# Patient Record
Sex: Male | Born: 1995 | Race: White | Hispanic: No | Marital: Married | Smoking: Never smoker
Health system: Southern US, Community
[De-identification: ages and names within clinical notes are randomized; demographics above are authoritative.]

---
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
## 2014-05-02 ENCOUNTER — Emergency Department: Payer: Self-pay | Admitting: Emergency Medicine

## 2014-05-02 LAB — BASIC METABOLIC PANEL
Anion Gap: 4 — ABNORMAL LOW (ref 7–16)
BUN: 14 mg/dL (ref 7–18)
CALCIUM: 9.6 mg/dL (ref 9.0–10.7)
CHLORIDE: 107 mmol/L (ref 98–107)
CREATININE: 1.06 mg/dL (ref 0.60–1.30)
Co2: 29 mmol/L (ref 21–32)
EGFR (African American): >60
EGFR (Non-African Amer.): >60
GLUCOSE: 87 mg/dL (ref 65–99)
Osmolality: 279 (ref 275–301)
Potassium: 3.9 mmol/L (ref 3.5–5.1)
SODIUM: 140 mmol/L (ref 136–145)

## 2014-05-02 LAB — CBC
HCT: 48.5 % (ref 40.0–52.0)
HGB: 15.7 g/dL (ref 13.0–18.0)
MCH: 29.1 pg (ref 26.0–34.0)
MCHC: 32.4 g/dL (ref 32.0–36.0)
MCV: 90 fL (ref 80–100)
PLATELETS: 241 10*3/uL (ref 150–440)
RBC: 5.41 10*6/uL (ref 4.40–5.90)
RDW: 13.3 % (ref 11.5–14.5)
WBC: 8.4 10*3/uL (ref 3.8–10.6)

## 2018-01-16 ENCOUNTER — Ambulatory Visit (INDEPENDENT_AMBULATORY_CARE_PROVIDER_SITE_OTHER): Payer: 59 | Admitting: Family Medicine

## 2018-01-16 ENCOUNTER — Other Ambulatory Visit: Payer: Self-pay

## 2018-01-16 ENCOUNTER — Encounter: Payer: Self-pay | Admitting: Family Medicine

## 2018-01-16 ENCOUNTER — Encounter: Payer: Self-pay | Admitting: *Deleted

## 2018-01-16 VITALS — BP 120/80 | HR 74 | Temp 98.5°F | Ht 72.0 in | Wt 189.2 lb

## 2018-01-16 DIAGNOSIS — N509 Disorder of male genital organs, unspecified: Secondary | ICD-10-CM

## 2018-01-16 DIAGNOSIS — N5089 Other specified disorders of the male genital organs: Secondary | ICD-10-CM

## 2018-01-16 NOTE — Patient Instructions (Signed)
REFERRALS TO SPECIALISTS, SPECIAL TESTS (MRI, CT, ULTRASOUNDS)  MARION or  Anastasiya will help you. ASK CHECK-IN FOR HELP.  Specialist appointment times vary a great deal, based on their schedule / openings. -- Some specialists have very long wait times. (Example. Dermatology)    

## 2018-01-16 NOTE — Progress Notes (Signed)
Dr. Karleen Hampshire T. Korie Streat, MD, CAQ [facility] [address] [place], [address] Phone: [phone] Fax: [phone]  01/16/2018  Patient: Dennis Boyer, MRN: [id number], DOB: [birth year], 22 y.o.  Primary Physician:  Excell Seltzer, MD   Chief Complaint  Patient presents with  . Lump Left Testicle   Subjective:   Dennis Boyer is a 22 y.o. very pleasant male patient who presents with the following:  New patient, not seen in 5 years.   Felt nausea some this week and felt some lump on the testicle. Today, felt a little better. Moving some pottery. Kind of soft.  It does not really hurt, but he has had some nausea and lower GI discomfort.  He is eating and drinking normally, having normal bowel movements.  He has not had any trauma or injury.  Is not had any pain.  There is nothing directly on the testicle itself.  He has had no STD exposure.  Left testicular mass.  Past Medical History, Surgical History, Social History, Family History, Problem List, Medications, and Allergies have been reviewed and updated if relevant.  Patient Active Problem List   Diagnosis Date Noted  . Chronic subdural hematoma (HCC) 03/27/2013  . 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 Unspecified constipation 03/27/2013  . Headache(784.0) 01/22/2013  . Dyshidrotic eczema 05/11/2011  . 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 CHILDHOOD OBESITY 10/04/2010  . ALLERGIC RHINITIS 10/04/2010  . ASTHMA, PERSISTENT, MILD 10/04/2010    History reviewed. No pertinent past medical history.  History reviewed. No pertinent surgical history.  Social History   Socioeconomic History  . Marital status: Single    Spouse name: Not on file  . Number of children: Not on file  . Years of education: Not on file  . Highest education level: Not on file  Occupational History  . Not on file  Social Needs  . Financial resource strain: Not on file  . Food  insecurity:    Worry: Not on file    Inability: Not on file  . Transportation needs:    Medical: Not on file    Non-medical: Not on file  Tobacco Use  . Smoking status: Never Smoker  . Smokeless tobacco: Never Used  Substance and Sexual Activity  . Alcohol use: No  . Drug use: No  . Sexual activity: Not on file  Lifestyle  . Physical activity:    Days per week: Not on file    Minutes per session: Not on file  . Stress: Not on file  Relationships  . Social connections:    Talks on phone: Not on file    Gets together: Not on file    Attends religious service: Not on file    Active member of club or organization: Not on file    Attends meetings of clubs or organizations: Not on file    Relationship status: Not on file  . Intimate partner violence:    Fear of current or ex partner: Not on file    Emotionally abused: Not on file    Physically abused: Not on file    Forced sexual activity: Not on file  Other Topics Concern  . Not on file  Social History Narrative   Lives with mom and dad   Only child   [employer]-- 10th grade   Plans on [employer]   Lifts weights regularly   Jogging several times a week   Diet: Fruits  and veggies, water, skim milk             Family History  Problem Relation Age of Onset  . Diabetes Father   . COPD Maternal Grandmother   . Hypertension Maternal Grandfather   . Cancer Maternal Grandfather        LUNG  . Diabetes Paternal Grandfather   . Stroke Paternal Grandfather     No Known Allergies  Medication list reviewed and updated in full in [facility].   GEN: No acute illnesses, no fevers, chills. GI: No n/v/d, eating normally Pulm: No SOB Interactive and getting along well at home.  Otherwise, ROS is as per the HPI.  Objective:   BP 120/80   Pulse 74   Temp 98.5 F (36.9 C) (Oral)   Ht 6' (1.829 m)   Wt 189 lb 4 oz (85.8 kg)   BMI 25.67 kg/m   GEN: WDWN, NAD, Non-toxic, A & O x 3 HEENT: Atraumatic,  Normocephalic. Neck supple. No masses, No LAD. Ears and Nose: No external deformity. EXTR: No c/c/e NEURO Normal gait.  PSYCH: Normally interactive. Conversant. Not depressed or anxious appearing.  Calm demeanor.   GU: Normal male.  On the right side the testicle is normal without mass.  Epididymis is normal, as well as the entirety of the vas that is palpated.  There is no appreciable hernia.  On the left side, the entirety of the testicle does appear to be normal without mass, but caudal to this there is dilation and a palpable soft tissue caudal to this.  This does not increase in size with coughing.  There is no pain with palpation.  Laboratory and Imaging Data:  Assessment and Plan:   Mass of left testicle - Plan: US SCROTUM  Clinically, this feels most like a varicocele.  Epididymitis would be less likely, and hernia less likely, but cannot fully be excluded.  Pertinent do ultrasound of the patient's scrotum to fully evaluate this.  Follow-up: No follow-ups on file.  Orders Placed This Encounter  Procedures  . US SCROTUM    Signed,  [place] T. Derrek Puff, MD   Allergies as of 01/16/2018   No Known Allergies     Medication List    as of 01/16/2018 11:19 AM   You have not been prescribed any medications.

## 2018-01-16 NOTE — Addendum Note (Signed)
Addended by: Damita Lack on: 01/16/2018 11:27 AM   Modules accepted: Orders

## 2018-01-20 ENCOUNTER — Ambulatory Visit
Admission: RE | Admit: 2018-01-20 | Discharge: 2018-01-20 | Disposition: A | Discharge status: Home / Self Care | Payer: 59 | Source: Ambulatory Visit | Attending: Family Medicine | Admitting: Family Medicine

## 2018-01-20 DIAGNOSIS — N509 Disorder of male genital organs, unspecified: Secondary | ICD-10-CM | POA: Insufficient documentation

## 2018-01-20 DIAGNOSIS — N5089 Other specified disorders of the male genital organs: Secondary | ICD-10-CM | POA: Diagnosis not present

## 2018-09-05 ENCOUNTER — Encounter: Payer: Self-pay | Admitting: Family Medicine

## 2018-09-05 ENCOUNTER — Ambulatory Visit (INDEPENDENT_AMBULATORY_CARE_PROVIDER_SITE_OTHER): Payer: 59 | Admitting: Family Medicine

## 2018-09-05 DIAGNOSIS — N50812 Left testicular pain: Secondary | ICD-10-CM

## 2018-09-05 LAB — POC URINALSYSI DIPSTICK (AUTOMATED)
BILIRUBIN UA: NEGATIVE
Blood, UA: NEGATIVE
GLUCOSE UA: NEGATIVE
Ketones, UA: NEGATIVE
LEUKOCYTES UA: NEGATIVE
Nitrite, UA: NEGATIVE
PH UA: 6 (ref 5.0–8.0)
Protein, UA: NEGATIVE
Spec Grav, UA: 1.01 (ref 1.010–1.025)
Urobilinogen, UA: 0.2 E.U./dL

## 2018-09-05 NOTE — Assessment & Plan Note (Signed)
New pain.Marland Kitchen intermittent. None now. Exam benign.. not clearly epididymitis.Marland Kitchen pt NEVER sexually active per report.  Eval with [facility].  Nml [place] last year, but given new pain.Marland Kitchen re-eval with [place]. If negative and continues  To have pain reconsider non GC/Chlam epididymitis although < 35 given never sexually active.

## 2018-09-05 NOTE — Patient Instructions (Signed)
Front office will call with your [place] referral.

## 2018-09-05 NOTE — Progress Notes (Signed)
   Subjective:    Patient ID: Dennis Boyer, male    DOB: 03/16/1996, 23 y.o.   MRN: [id number]  HPI    23 year old male presents with 2 days onset of  Testicular pain.  12/2017 left testicular mass noted. US scrotum showed no abnormality.  Clinically felt like varicocele.  He reports he noted slight pain in left testicle.. felt like vein was bigger  Near the testicle. Pain was 1-2/10... since then pain comes and goes. Notes more pain when crossing legs on lying on side.  no abd pain, no groin pain.   no rash, no visual change of skin. No penile discharge.  no penile pain.  no pain in perineum.   No injury or trauma.  Not sexually active.. has never been.   No family history of testicular cancer etc.  Review of Systems  Constitutional: Negative for fatigue and fever.  HENT: Negative for ear pain.   Eyes: Negative for pain.  Respiratory: Negative for cough and shortness of breath.   Cardiovascular: Negative for chest pain, palpitations and leg swelling.  Gastrointestinal: Negative for abdominal pain.  Genitourinary: Negative for dysuria.  Musculoskeletal: Negative for arthralgias.  Neurological: Negative for syncope, light-headedness and headaches.  Psychiatric/Behavioral: Negative for dysphoric mood.       Objective:   Physical Exam Constitutional:      Appearance: He is well-developed.  HENT:     Head: Normocephalic.     Right Ear: Hearing normal.     Left Ear: Hearing normal.     Nose: Nose normal.  Neck:     Thyroid: No thyroid mass or thyromegaly.     Vascular: No carotid bruit.     Trachea: Trachea normal.  Cardiovascular:     Rate and Rhythm: Normal rate and regular rhythm.     Pulses: Normal pulses.     Heart sounds: Heart sounds not distant. No murmur. No friction rub. No gallop.      Comments: No peripheral edema Pulmonary:     Effort: Pulmonary effort is normal. No respiratory distress.     Breath sounds: Normal breath sounds.  Abdominal:   Hernia: There is no hernia in the right inguinal area or left inguinal area.  Genitourinary:    Scrotum/Testes: Normal. Cremasteric reflex is present.        Right: Mass, tenderness, swelling, testicular hydrocele or varicocele not present. Right testis is descended.        Left: Mass, tenderness, swelling or testicular hydrocele not present. Left testis is descended.     Epididymis:     Right: Normal. Not inflamed or enlarged. No mass.     Left: Normal. Not inflamed or enlarged. No mass.     Comments:  Vessels slight more prominent on left than right Lymphadenopathy:     Lower Body: No right inguinal adenopathy. No left inguinal adenopathy.  Skin:    General: Skin is warm and dry.     Findings: No rash.  Psychiatric:        Speech: Speech normal.        Behavior: Behavior normal.        Thought Content: Thought content normal.           Assessment & Plan:

## 2018-09-05 NOTE — Addendum Note (Signed)
Addended by: Damita Lack on: 09/05/2018 11:04 AM   Modules accepted: Orders

## 2018-09-05 NOTE — Addendum Note (Signed)
Addended by: Damita Lack on: 09/05/2018 10:54 AM   Modules accepted: Orders

## 2018-09-08 ENCOUNTER — Ambulatory Visit
Admission: RE | Admit: 2018-09-08 | Discharge: 2018-09-08 | Disposition: A | Discharge status: Home / Self Care | Payer: 59 | Source: Ambulatory Visit | Attending: Family Medicine | Admitting: Family Medicine

## 2018-09-08 DIAGNOSIS — N50812 Left testicular pain: Secondary | ICD-10-CM | POA: Diagnosis not present

## 2019-05-12 ENCOUNTER — Encounter: Payer: Self-pay | Admitting: Family Medicine

## 2019-05-12 ENCOUNTER — Ambulatory Visit (INDEPENDENT_AMBULATORY_CARE_PROVIDER_SITE_OTHER): Payer: 59 | Admitting: Family Medicine

## 2019-05-12 ENCOUNTER — Other Ambulatory Visit: Payer: Self-pay

## 2019-05-12 VITALS — BP 120/90 | HR 69 | Temp 98.2°F | Ht 72.0 in | Wt 203.5 lb

## 2019-05-12 DIAGNOSIS — H6982 Other specified disorders of Eustachian tube, left ear: Secondary | ICD-10-CM

## 2019-05-12 NOTE — Progress Notes (Signed)
  Chief Complaint  Patient presents with  . left ear issue    hears "wave like sounds"    History of Present Illness: HPI  23 year old male with 24 hours of left ear feeling hot, wave like sound, rushing sound.  Nml hearing in both ears.  No ear pain, no sinus pain, no pressure. Mild ear pressure in left ear. No ST, no cough, no fever.  No headache. No heartbeat sound.  BP Readings from Last 3 Encounters:  05/12/19 120/90  09/05/18 120/70  01/16/18 120/80   No new meds, no new OTC meds or supplements.  No history of recurrent ear infections.  Mild asthma, no current issues.  COVID 19 screen No recent travel or known exposure to COVID19 The patient denies respiratory symptoms of COVID 19 at this time.  The importance of social distancing was discussed today.   Review of Systems  Constitutional: Negative for chills and fever.  HENT: Negative for congestion and ear pain.   Eyes: Negative for pain and redness.  Respiratory: Negative for cough and shortness of breath.   Cardiovascular: Negative for chest pain, palpitations and leg swelling.  Gastrointestinal: Negative for abdominal pain, blood in stool, constipation, diarrhea, nausea and vomiting.  Genitourinary: Negative for dysuria.  Musculoskeletal: Negative for falls and myalgias.  Skin: Negative for rash.  Neurological: Negative for dizziness.  Psychiatric/Behavioral: Negative for depression. The patient is not nervous/anxious.       History reviewed. No pertinent past medical history.  reports that he has never smoked. He has never used smokeless tobacco. He reports that he does not drink alcohol or use drugs.  No current outpatient medications on file.   Observations/Objective: Blood pressure 120/90, pulse 69, temperature 98.2 F (36.8 C), temperature source Temporal, height 6' (1.829 m), weight 203 lb 8 oz (92.3 kg), SpO2 97 %.  Physical Exam Constitutional:      Appearance: He is well-developed.  HENT:    Head: Normocephalic.     Right Ear: Hearing normal. A middle ear effusion is present. Tympanic membrane is bulging. Tympanic membrane is not injected, erythematous or retracted.     Left Ear: Hearing normal.  No middle ear effusion. Tympanic membrane is not injected, erythematous, retracted or bulging.     Nose: Nose normal.  Neck:     Thyroid: No thyroid mass or thyromegaly.     Vascular: No carotid bruit.     Trachea: Trachea normal.  Cardiovascular:     Rate and Rhythm: Normal rate and regular rhythm.     Pulses: Normal pulses.     Heart sounds: Heart sounds not distant. No murmur. No friction rub. No gallop.      Comments: No peripheral edema Pulmonary:     Effort: Pulmonary effort is normal. No respiratory distress.     Breath sounds: Normal breath sounds.  Skin:    General: Skin is warm and dry.     Findings: No rash.  Psychiatric:        Speech: Speech normal.        Behavior: Behavior normal.        Thought Content: Thought content normal.      Assessment and Plan   ETD (Eustachian tube dysfunction), left Treat with antihistamine, nasal steroid and decongestant. If not improving consider antibiotics.     Eliezer Lofts, MD

## 2019-05-12 NOTE — Assessment & Plan Note (Signed)
Treat with antihistamine, nasal steroid and decongestant. If not improving consider antibiotics.

## 2019-05-12 NOTE — Patient Instructions (Signed)
Start flonase 2 sprays per nostril daily.  Start zyrtec or Claritin D 1-2 times daily.  Call if notimproving in next 1-2 weeks or sooner if increasing ear pain,or fever.

## 2019-07-03 ENCOUNTER — Ambulatory Visit (INDEPENDENT_AMBULATORY_CARE_PROVIDER_SITE_OTHER): Payer: 59 | Admitting: Primary Care

## 2019-07-03 ENCOUNTER — Other Ambulatory Visit: Payer: Self-pay | Admitting: *Deleted

## 2019-07-03 VITALS — Temp 100.0°F

## 2019-07-03 DIAGNOSIS — Z20822 Contact with and (suspected) exposure to covid-19: Secondary | ICD-10-CM

## 2019-07-03 DIAGNOSIS — R509 Fever, unspecified: Secondary | ICD-10-CM

## 2019-07-03 NOTE — Progress Notes (Signed)
Subjective:    Patient ID: Dennis Boyer, male    DOB: 1996/06/10, 23 y.o.   MRN: [id number]  HPI  Virtual Visit via Video Note  I connected with Dennis Boyer on 07/03/19 at 10:40 AM EST by a video enabled telemedicine application and verified that I am speaking with the correct person using two identifiers.  Location: Patient: Home Provider: Office   I discussed the limitations of evaluation and management by telemedicine and the availability of in person appointments. The patient expressed understanding and agreed to proceed.  History of Present Illness:  Dennis Boyer is a 23 year old male patient of Dr. Ermalene Searing with a history of allergic rhinitis, asthma, chronic subdural hematoma, eustachian tube dysfunction who presents today with a chief complaint of fever.  His temperatures are ranging 100.0-102 temporal. He had his flu shot in September 2020. He also reports body aches, headache that began yesterday, fever of 102 yesterday. He's taken Aleve with some improvement in all symptoms. His fever this morning was 100.0 temporal. He denies known Covid-19 exposure, last time he's been out of the house was five days ago, was wearing a mask.   He denies loss of taste/smell, diarrhea, cough. Overall today he's feeling slightly better.    Observations/Objective:  Alert and oriented. Appears well, not sickly. No distress. Speaking in complete sentences. No cough during exam.  Assessment and Plan:  Fever with body aches, chills, headache x 24 hours. Did have flu shot two months ago. Given ongoing pandemic we will send him for Covid-19 testing. Dicussed home care instructions and quarantine instructions. He will update if symptoms progress.   Follow Up Instructions:  Go for Covid-19 testing as discussed.  Remain home until your Covid test has returned and/or until you have been fever free for three consecutive days without the use of Aleve/Tyelnol, etc.   It was  a pleasure meeting you! Mayra Reel, NP-C  I discussed the assessment and treatment plan with the patient. The patient was provided an opportunity to ask questions and all were answered. The patient agreed with the plan and demonstrated an understanding of the instructions.   The patient was advised to call back or seek an in-person evaluation if the symptoms worsen or if the condition fails to improve as anticipated.     Doreene Nest, NP    Review of Systems  Constitutional: Positive for chills, fatigue and fever.  HENT:       Denies loss of taste/smell  Respiratory: Negative for cough and wheezing.   Gastrointestinal: Negative for diarrhea.  Allergic/Immunologic: Positive for environmental allergies.  Neurological: Positive for headaches.       No past medical history on file.   Social History   Socioeconomic History  . Marital status: Single    Spouse name: Not on file  . Number of children: Not on file  . Years of education: Not on file  . Highest education level: Not on file  Occupational History  . Not on file  Social Needs  . Financial resource strain: Not on file  . Food insecurity    Worry: Not on file    Inability: Not on file  . Transportation needs    Medical: Not on file    Non-medical: Not on file  Tobacco Use  . Smoking status: Never Smoker  . Smokeless tobacco: Never Used  Substance and Sexual Activity  . Alcohol use: No  . Drug use: No  . Sexual activity: Not  on file  Lifestyle  . Physical activity    Days per week: Not on file    Minutes per session: Not on file  . Stress: Not on file  Relationships  . Social Herbalist on phone: Not on file    Gets together: Not on file    Attends religious service: Not on file    Active member of club or organization: Not on file    Attends meetings of clubs or organizations: Not on file    Relationship status: Not on file  . Intimate partner violence    Fear of current or ex partner:  Not on file    Emotionally abused: Not on file    Physically abused: Not on file    Forced sexual activity: Not on file  Other Topics Concern  . Not on file  Social History Narrative   Lives with mom and dad   Only child   [employer]-- 10th grade   Plans on [employer]   Lifts weights regularly   Jogging several times a week   Diet: Fruits and veggies, water, skim milk             No past surgical history on file.  Family History  Problem Relation Age of Onset  . Diabetes Father   . COPD Maternal Grandmother   . Hypertension Maternal Grandfather   . Cancer Maternal Grandfather        LUNG  . Diabetes Paternal Grandfather   . Stroke Paternal Grandfather     No Known Allergies  No current outpatient medications on file prior to visit.   No current facility-administered medications on file prior to visit.     Temp 100 F (37.8 C) (Temporal)    Objective:   Physical Exam  Constitutional: He is oriented to person, place, and time. He appears well-nourished. He does not have a sickly appearance. He does not appear ill.  Respiratory: Effort normal. No respiratory distress.  No cough during exam  Neurological: He is alert and oriented to person, place, and time.  Psychiatric: He has a normal mood and affect.           Assessment & Plan:

## 2019-07-03 NOTE — Patient Instructions (Signed)
Go for Covid-19 testing as discussed.  Remain home until your Covid test has returned and/or until you have been fever free for three consecutive days without the use of Aleve/Tyelnol, etc.   It was a pleasure meeting you! Allie Bossier, NP-C

## 2019-07-06 LAB — NOVEL CORONAVIRUS, NAA: SARS-CoV-2, NAA: NOT DETECTED

## 2019-07-07 ENCOUNTER — Telehealth: Payer: Self-pay

## 2019-07-07 NOTE — Telephone Encounter (Signed)
Patient called and states he needs a copy of his immunization record, as he is getting his paperwork and all ready for college and they require a copy of his immunizations.

## 2019-07-07 NOTE — Telephone Encounter (Signed)
I looked him up on [facility] and did not find a complete record. He will check with his former high school and see if they can help.

## 2019-08-06 ENCOUNTER — Telehealth: Payer: Self-pay | Admitting: Family Medicine

## 2019-08-06 NOTE — Telephone Encounter (Signed)
Left v/m for pt to call [facility].

## 2019-08-06 NOTE — Telephone Encounter (Signed)
Mom Wannetta Sender) call pt needs a refill on a inhaler he had not has one in over a year pt is in [place].  Mom will have pt call office so we can triage him  Mom thinks he has SOB

## 2019-08-06 NOTE — Telephone Encounter (Signed)
Pt called back and said he needs the albuterol inhaler due to being around cats. Pt said he does not have SOB. Pt has been staying with a friend who has cats for 2 weeks;no cough, no SOB,no fever,no distress with breathing. Pt has hx of asthma and pt can tell some difference in breathing but no SOB. Pt request refill albuterol inhaler to [facility].  last refilled albuterol in [facility] # 3 inhaler x3 10/18/2011. Pt last acute visit 07/03/19 and do not see recent FU or annual.Please advise. UC & ED precautions given and pt voiced understanding.

## 2019-08-07 MED ORDER — ALBUTEROL SULFATE HFA 108 (90 BASE) MCG/ACT IN AERS: 2.0000 | INHALATION_SPRAY | RESPIRATORY_TRACT | 1 refills | Status: DC | PRN

## 2019-08-07 NOTE — Telephone Encounter (Signed)
I will refill once but pt needs to be seen for annual physical. Call to schedule.

## 2019-08-12 NOTE — Telephone Encounter (Signed)
Labs 2/1 cpx 2/5 Pt aware

## 2019-09-21 ENCOUNTER — Other Ambulatory Visit (INDEPENDENT_AMBULATORY_CARE_PROVIDER_SITE_OTHER): Payer: 59

## 2019-09-21 ENCOUNTER — Telehealth: Payer: Self-pay | Admitting: Family Medicine

## 2019-09-21 DIAGNOSIS — Z130 Encounter for screening for diseases of the blood and blood-forming organs and certain disorders involving the immune mechanism: Secondary | ICD-10-CM | POA: Diagnosis not present

## 2019-09-21 DIAGNOSIS — Z13220 Encounter for screening for lipoid disorders: Secondary | ICD-10-CM | POA: Diagnosis not present

## 2019-09-21 NOTE — Telephone Encounter (Signed)
-----   Message from Aquilla Solian, RT sent at 09/11/2019  1:25 PM EST ----- Regarding: Lab Orders for Monday 2.1.2021 Please place lab orders for Monday 2.1.2021, office visit for physical on Friday 2.5.2021 Thank you, Jones Bales RT(R)

## 2019-09-22 LAB — COMPREHENSIVE METABOLIC PANEL
ALT: 10 U/L (ref 0–53)
AST: 16 U/L (ref 0–37)
Albumin: 4.5 g/dL (ref 3.5–5.2)
Alkaline Phosphatase: 51 U/L (ref 39–117)
BUN: 15 mg/dL (ref 6–23)
CO2: 29 mEq/L (ref 19–32)
Calcium: 9.8 mg/dL (ref 8.4–10.5)
Chloride: 101 mEq/L (ref 96–112)
Creatinine, Ser: 0.97 mg/dL (ref 0.40–1.50)
GFR: 94.7 mL/min (ref >60.00)
Glucose, Bld: 79 mg/dL (ref 70–99)
Potassium: 4.3 mEq/L (ref 3.5–5.1)
Sodium: 140 mEq/L (ref 135–145)
Total Bilirubin: 0.8 mg/dL (ref 0.2–1.2)
Total Protein: 7.4 g/dL (ref 6.0–8.3)

## 2019-09-22 LAB — CBC WITH DIFFERENTIAL/PLATELET
Basophils Absolute: 0 10*3/uL (ref 0.0–0.1)
Basophils Relative: 0.5 % (ref 0.0–3.0)
Eosinophils Absolute: 0.3 10*3/uL (ref 0.0–0.7)
Eosinophils Relative: 3.9 % (ref 0.0–5.0)
HCT: 42.4 % (ref 39.0–52.0)
Hemoglobin: 14.5 g/dL (ref 13.0–17.0)
Lymphocytes Relative: 25.9 % (ref 12.0–46.0)
Lymphs Abs: 1.9 10*3/uL (ref 0.7–4.0)
MCHC: 34.1 g/dL (ref 30.0–36.0)
MCV: 89.4 fl (ref 78.0–100.0)
Monocytes Absolute: 0.5 10*3/uL (ref 0.1–1.0)
Monocytes Relative: 7.5 % (ref 3.0–12.0)
Neutro Abs: 4.5 10*3/uL (ref 1.4–7.7)
Neutrophils Relative %: 62.2 % (ref 43.0–77.0)
Platelets: 271 10*3/uL (ref 150.0–400.0)
RBC: 4.75 Mil/uL (ref 4.22–5.81)
RDW: 12.9 % (ref 11.5–15.5)
WBC: 7.3 10*3/uL (ref 4.0–10.5)

## 2019-09-22 LAB — LIPID PANEL
Cholesterol: 133 mg/dL (ref 0–200)
HDL: 53.4 mg/dL (ref >39.00)
LDL Cholesterol: 73 mg/dL (ref 0–99)
NonHDL: 79.5
Total CHOL/HDL Ratio: 2
Triglycerides: 33 mg/dL (ref 0.0–149.0)
VLDL: 6.6 mg/dL (ref 0.0–40.0)

## 2019-09-22 NOTE — Progress Notes (Signed)
No critical labs need to be addressed urgently. We will discuss labs in detail at upcoming office visit.   

## 2019-09-25 ENCOUNTER — Ambulatory Visit (INDEPENDENT_AMBULATORY_CARE_PROVIDER_SITE_OTHER): Payer: 59 | Admitting: Family Medicine

## 2019-09-25 ENCOUNTER — Other Ambulatory Visit: Payer: Self-pay

## 2019-09-25 ENCOUNTER — Encounter: Payer: Self-pay | Admitting: Family Medicine

## 2019-09-25 VITALS — BP 110/82 | HR 72 | Temp 97.8°F | Ht 72.25 in | Wt 201.0 lb

## 2019-09-25 DIAGNOSIS — Z0000 Encounter for general adult medical examination without abnormal findings: Secondary | ICD-10-CM

## 2019-09-25 DIAGNOSIS — Z23 Encounter for immunization: Secondary | ICD-10-CM

## 2019-09-25 NOTE — Progress Notes (Signed)
Chief Complaint  Patient presents with  . Annual Exam    History of Present Illness: HPI  The patient is here for annual wellness exam and preventative care.     Feeling well overall.  Mild persistent asthma: uses albuterol prn. Using after exercise.  Hx of chronic subdural hematoma, ararcnoid cyst: no headache, no vision cahnges no issues.   Reviewed labs in detail, nml cbc, cmet and lipids.   Body mass index is 27.07 kg/m. Exercise: was doing several times a week. Diet:  moderate Wt Readings from Last 3 Encounters:  09/25/19 201 lb (91.2 kg)  05/12/19 203 lb 8 oz (92.3 kg)  09/05/18 190 lb 12 oz (86.5 kg)     Depression screen PHQ 2/9 09/25/2019  Decreased Interest 0  Down, Depressed, Hopeless 0  PHQ - 2 Score 0    This visit occurred during the SARS-CoV-2 public health emergency.  Safety protocols were in place, including screening questions prior to the visit, additional usage of staff PPE, and extensive cleaning of exam room while observing appropriate contact time as indicated for disinfecting solutions.   COVID 19 screen:  No recent travel or known exposure to COVID19 The patient denies respiratory symptoms of COVID 19 at this time. The importance of social distancing was discussed today.     Review of Systems  Constitutional: Negative for chills and fever.  HENT: Negative for congestion and ear pain.   Eyes: Negative for pain and redness.  Respiratory: Negative for cough and shortness of breath.   Cardiovascular: Negative for chest pain, palpitations and leg swelling.  Gastrointestinal: Negative for abdominal pain, blood in stool, constipation, diarrhea, nausea and vomiting.  Genitourinary: Negative for dysuria.  Musculoskeletal: Negative for falls and myalgias.  Skin: Negative for rash.  Neurological: Negative for dizziness.  Psychiatric/Behavioral: Negative for depression. The patient is not nervous/anxious.       No past medical history on file.  reports that he has never smoked. He has never used smokeless tobacco. He reports that he does not drink alcohol or use drugs.   Current Outpatient Medications:  .  albuterol (VENTOLIN HFA) 108 (90 Base) MCG/ACT inhaler, Inhale 2 puffs into the lungs every 4 (four) hours as needed for wheezing., Disp: 6.7 g, Rfl: 1 .  loratadine (CLARITIN) 10 MG tablet, Take 10 mg by mouth daily as needed for allergies., Disp: , Rfl:    Observations/Objective: Blood pressure 110/82, pulse 72, temperature 97.8 F (36.6 C), height 6' 0.25" (1.835 m), weight 201 lb (91.2 kg), SpO2 94 %.  Physical Exam Constitutional:      General: He is not in acute distress.    Appearance: Normal appearance. He is well-developed. He is not ill-appearing or toxic-appearing.  HENT:     Head: Normocephalic and atraumatic.     Right Ear: Hearing, tympanic membrane, ear canal and external ear normal.     Left Ear: Hearing, tympanic membrane, ear canal and external ear normal.     Nose: Nose normal.     Mouth/Throat:     Pharynx: Uvula midline.  Eyes:     General: Lids are normal. Lids are everted, no foreign bodies appreciated.     Conjunctiva/sclera: Conjunctivae normal.     Pupils: Pupils are equal, round, and reactive to light.  Neck:     Thyroid: No thyroid mass or thyromegaly.     Vascular: No carotid bruit.     Trachea: Trachea and phonation normal.  Cardiovascular:     Rate and  Rhythm: Normal rate and regular rhythm.     Pulses: Normal pulses.     Heart sounds: S1 normal and S2 normal. No murmur. No gallop.   Pulmonary:     Breath sounds: Normal breath sounds. No wheezing, rhonchi or rales.  Abdominal:     General: Bowel sounds are normal.     Palpations: Abdomen is soft.     Tenderness: There is no abdominal tenderness. There is no guarding or rebound.     Hernia: No hernia is present.  Musculoskeletal:     Cervical back: Normal range of motion and neck supple.  Lymphadenopathy:     Cervical: No cervical  adenopathy.  Skin:    General: Skin is warm and dry.     Findings: No rash.  Neurological:     Mental Status: He is alert.     Cranial Nerves: No cranial nerve deficit.     Sensory: No sensory deficit.     Gait: Gait normal.     Deep Tendon Reflexes: Reflexes are normal and symmetric.  Psychiatric:        Speech: Speech normal.        Behavior: Behavior normal.        Judgment: Judgment normal.      Assessment and Plan   The patient's preventative maintenance and recommended screening tests for an annual wellness exam were reviewed in full today. Brought up to date unless services declined.  Counselled on the importance of diet, exercise, and its role in overall health and mortality. The patient's FH and SH was reviewed, including their home life, tobacco status, and drug and alcohol status.     Vaccines: Due for Tdap, uptodate with flu Prostate Cancer Screen: no family history  Colon Cancer Screen: no early family history      Smoking Status: nonsmoker ETOH/ drug use: moderate (1-2 drink every other night)/none  HIV screen:   No concern,  Low number of partners.   Kerby Nora, MD

## 2019-09-25 NOTE — Patient Instructions (Signed)
Preventive Care 19-25 Years Old, Male Preventive care refers to lifestyle choices and visits with your health care provider that can promote health and wellness. This includes:  A yearly physical exam. This is also called an annual well check.  Regular dental and eye exams.  Immunizations.  Screening for certain conditions.  Healthy lifestyle choices, such as eating a healthy diet, getting regular exercise, not using drugs or products that contain nicotine and tobacco, and limiting alcohol use. What can I expect for my preventive care visit? Physical exam Your health care provider will check:  Height and weight. These may be used to calculate body mass index (BMI), which is a measurement that tells if you are at a healthy weight.  Heart rate and blood pressure.  Your skin for abnormal spots. Counseling Your health care provider may ask you questions about:  Alcohol, tobacco, and drug use.  Emotional well-being.  Home and relationship well-being.  Sexual activity.  Eating habits.  Work and work Statistician. What immunizations do I need?  Influenza (flu) vaccine  This is recommended every year. Tetanus, diphtheria, and pertussis (Tdap) vaccine  You may need a Td booster every 10 years. Varicella (chickenpox) vaccine  You may need this vaccine if you have not already been vaccinated. Human papillomavirus (HPV) vaccine  If recommended by your health care provider, you may need three doses over 6 months. Measles, mumps, and rubella (MMR) vaccine  You may need at least one dose of MMR. You may also need a second dose. Meningococcal conjugate (MenACWY) vaccine  One dose is recommended if you are 45-76 years old and a Market researcher living in a residence hall, or if you have one of several medical conditions. You may also need additional booster doses. Pneumococcal conjugate (PCV13) vaccine  You may need this if you have certain conditions and were not  previously vaccinated. Pneumococcal polysaccharide (PPSV23) vaccine  You may need one or two doses if you smoke cigarettes or if you have certain conditions. Hepatitis A vaccine  You may need this if you have certain conditions or if you travel or work in places where you may be exposed to hepatitis A. Hepatitis B vaccine  You may need this if you have certain conditions or if you travel or work in places where you may be exposed to hepatitis B. Haemophilus influenzae type b (Hib) vaccine  You may need this if you have certain risk factors. You may receive vaccines as individual doses or as more than one vaccine together in one shot (combination vaccines). Talk with your health care provider about the risks and benefits of combination vaccines. What tests do I need? Blood tests  Lipid and cholesterol levels. These may be checked every 5 years starting at age 17.  Hepatitis C test.  Hepatitis B test. Screening   Diabetes screening. This is done by checking your blood sugar (glucose) after you have not eaten for a while (fasting).  Sexually transmitted disease (STD) testing. Talk with your health care provider about your test results, treatment options, and if necessary, the need for more tests. Follow these instructions at home: Eating and drinking   Eat a diet that includes fresh fruits and vegetables, whole grains, lean protein, and low-fat dairy products.  Take vitamin and mineral supplements as recommended by your health care provider.  Do not drink alcohol if your health care provider tells you not to drink.  If you drink alcohol: ? Limit how much you have to 0-2  drinks a day. ? Be aware of how much alcohol is in your drink. In the [place] one drink equals one 12 oz bottle of beer (355 mL), one 5 oz glass of wine (148 mL), or one 1 oz glass of hard liquor (44 mL). Lifestyle  Take daily care of your teeth and gums.  Stay active. Exercise for at least 30 minutes on 5 or  more days each week.  Do not use any products that contain nicotine or tobacco, such as cigarettes, e-cigarettes, and chewing tobacco. If you need help quitting, ask your health care provider.  If you are sexually active, practice safe sex. Use a condom or other form of protection to prevent STIs (sexually transmitted infections). What's next?  Go to your health care provider once a year for a well check visit.  Ask your health care provider how often you should have your eyes and teeth checked.  Stay up to date on all vaccines. This information is not intended to replace advice given to you by your health care provider. Make sure you discuss any questions you have with your health care provider. Document Revised: 07/31/2018 Document Reviewed: 07/31/2018 Elsevier Patient Education  2020 [employer].

## 2019-09-25 NOTE — Addendum Note (Signed)
Addended by: Consuella Lose on: 09/25/2019 05:06 PM   Modules accepted: Orders

## 2019-10-07 IMAGING — US US SCROTUM W/ DOPPLER COMPLETE
1 series · 14 of 25 positions shown · non-contrast
Comparison: None.

CLINICAL DATA: Initial evaluation for left testicular mass.

EXAM:
SCROTAL ULTRASOUND
DOPPLER ULTRASOUND OF THE TESTICLES
TECHNIQUE: Complete ultrasound examination of the testicles, epididymis, and
other scrotal structures was performed. Color and spectral Doppler
ultrasound were also utilized to evaluate blood flow to the
testicles.

[Series 1: us scrotum w/ doppler complete · 0.07mm/px · 14 of 95 slices shown]
[im 1/95]
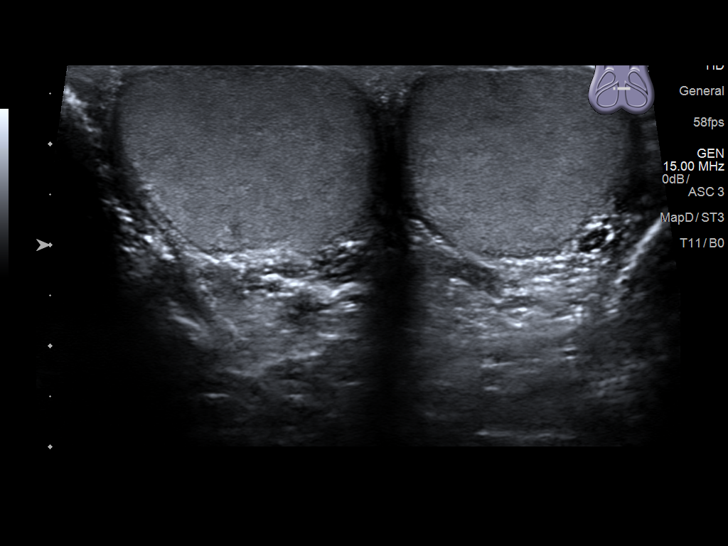
[im 8/95]
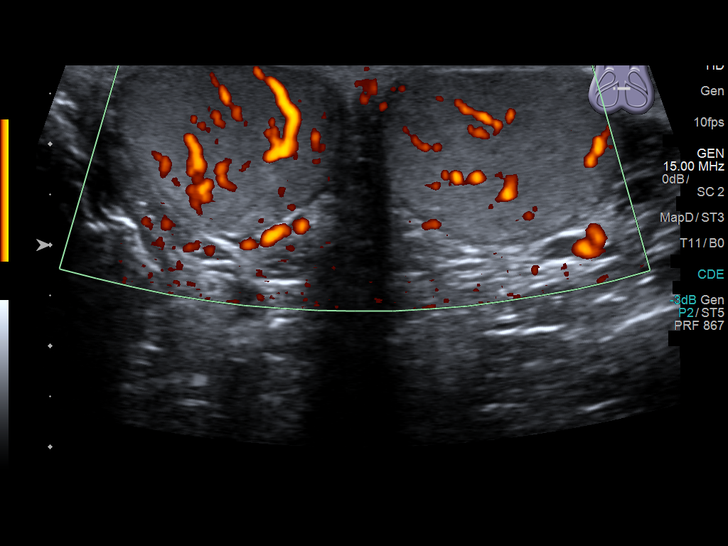
[im 16/95]
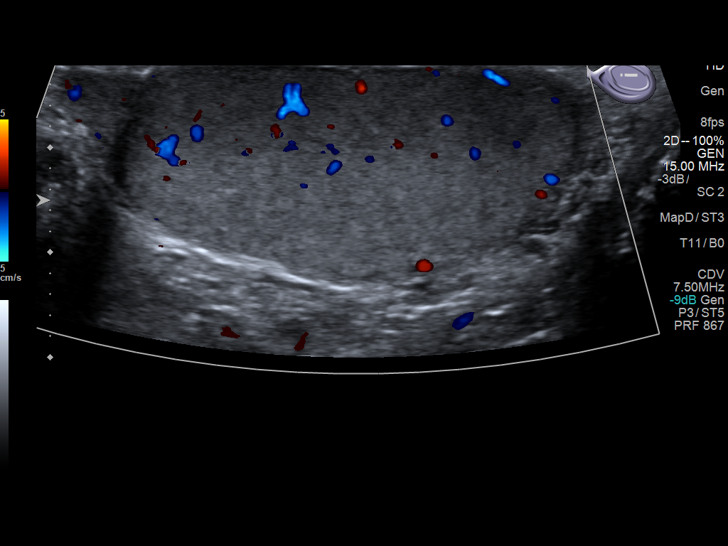
[im 24/95]
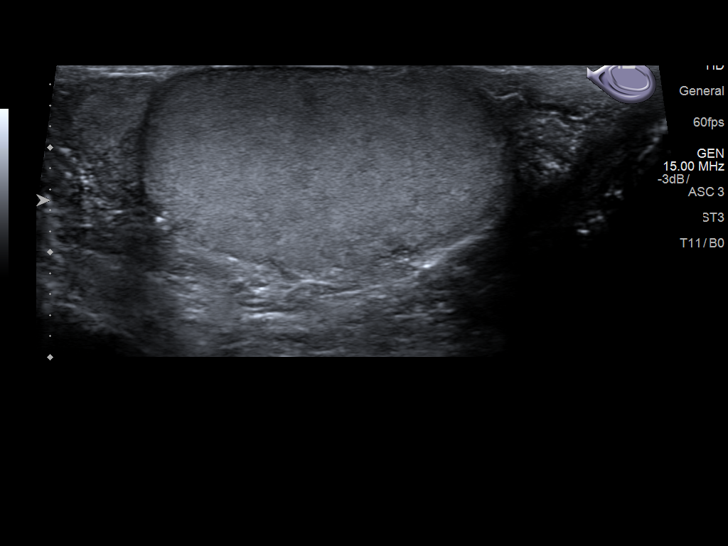
[im 32/95]
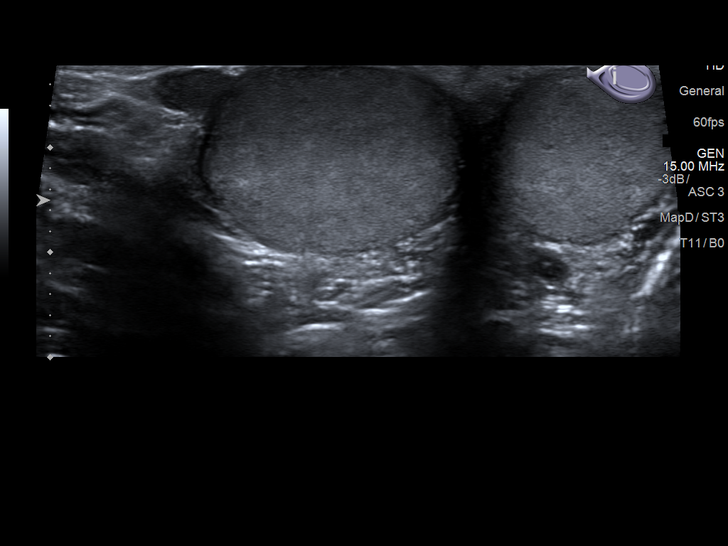
[im 36/95]
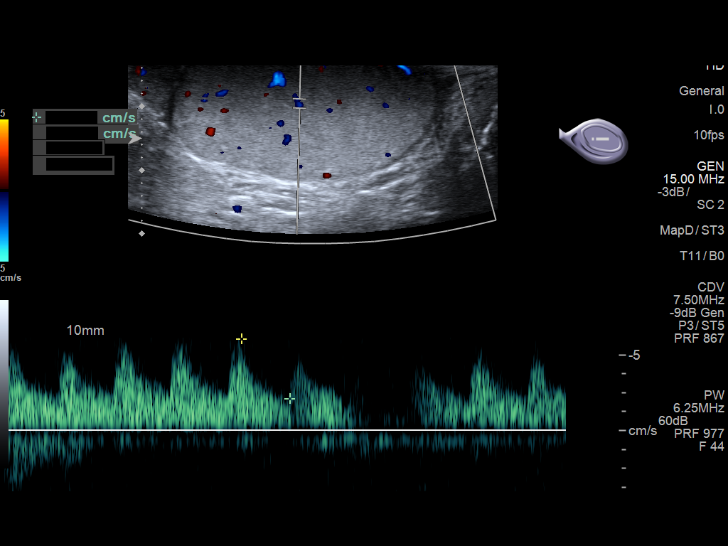
[im 44/95]
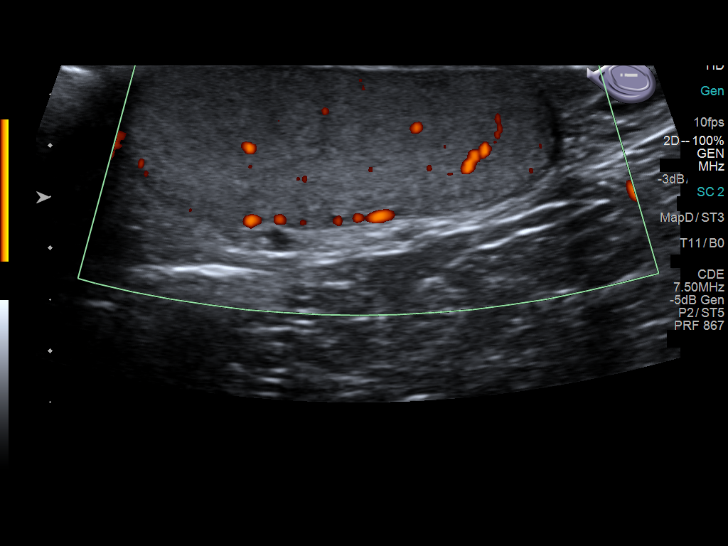
[im 51/95]
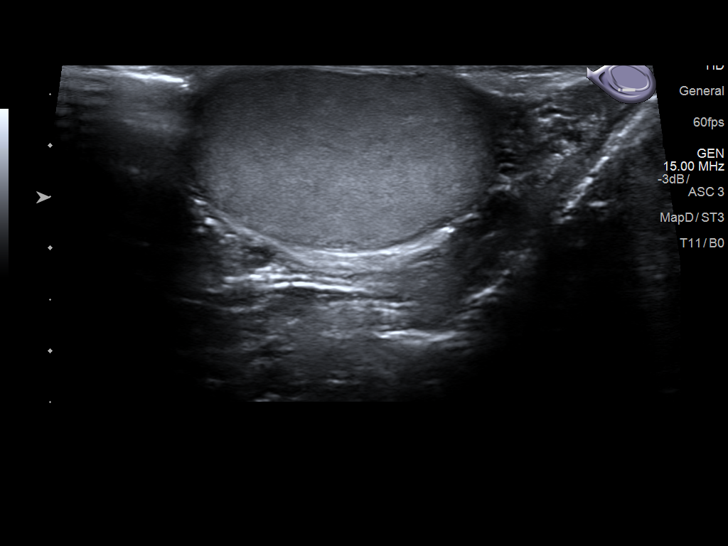
[im 59/95]
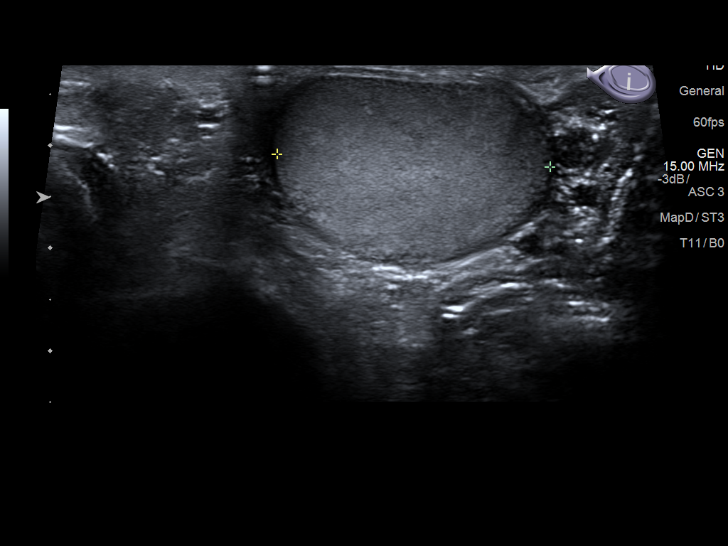
[im 63/95]
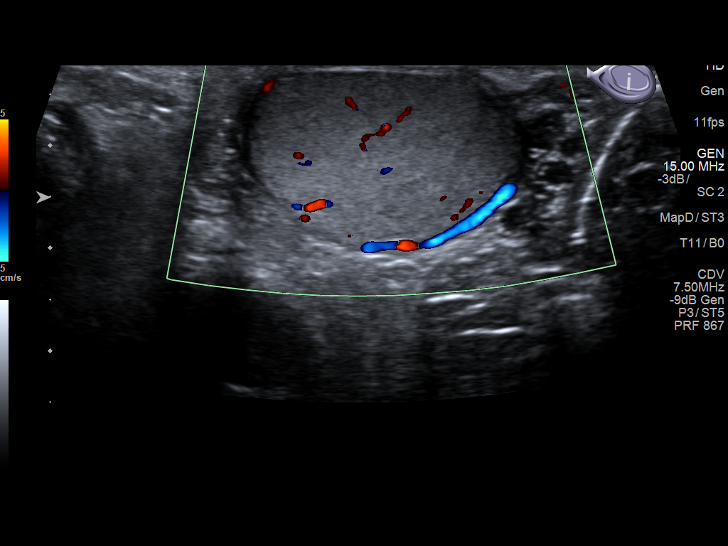
[im 71/95]
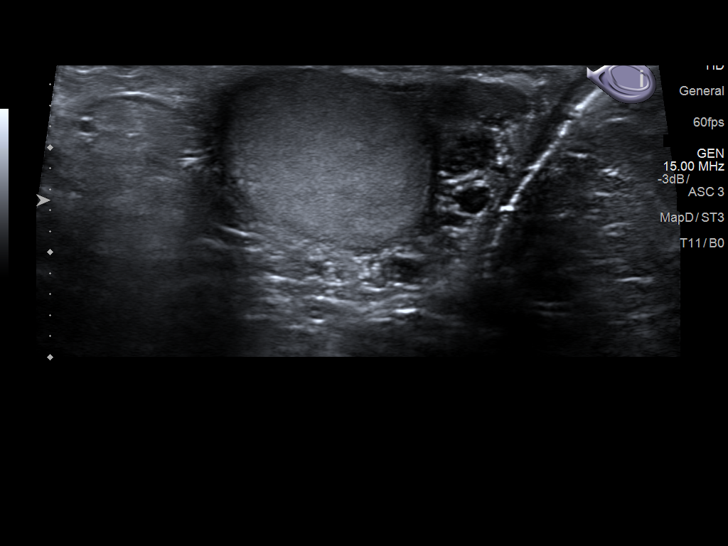
[im 79/95]
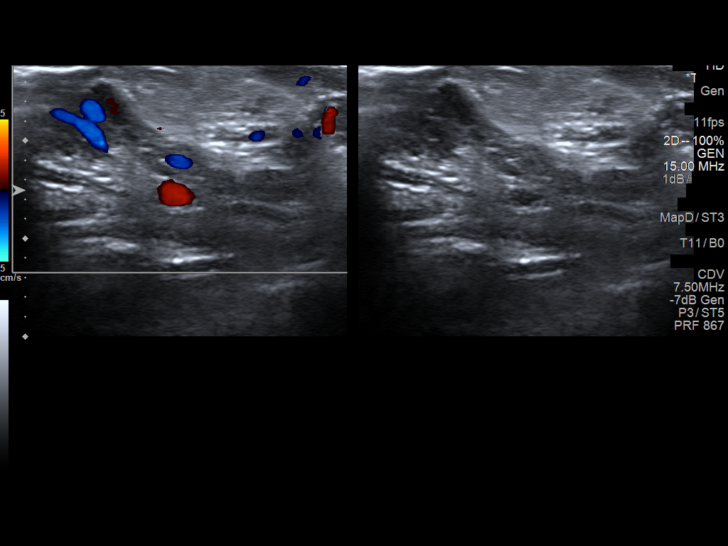
[im 87/95]
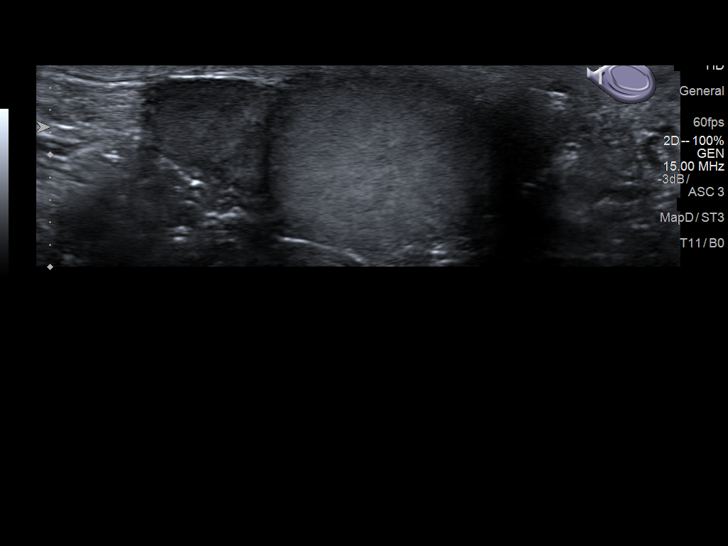
[im 95/95]
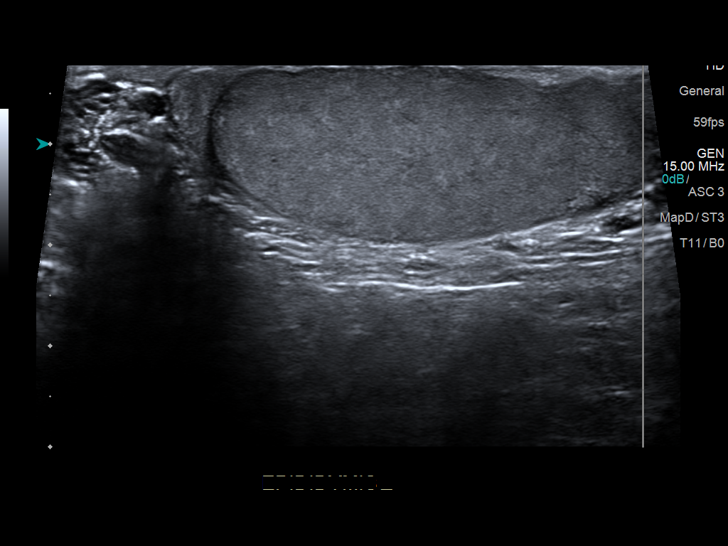

[14 of 25 positions shown; findings below may reference images not displayed]

FINDINGS: Right testicle

Measurements: 4.4 x 2.2 x 2.7 cm. No mass or microlithiasis
visualized.

Left testicle

Measurements: 4.7 x 1.9 x 2.7 cm. No mass or microlithiasis
visualized.

Right epididymis:  Normal in size and appearance.

Left epididymis:  Normal in size and appearance.

Hydrocele:  None visualized.

Varicocele:  None visualized.

Pulsed Doppler interrogation of both testes demonstrates normal low
resistance arterial and venous waveforms bilaterally.
IMPRESSION: Normal scrotal ultrasound. No discrete mass or other abnormality
identified.

## 2020-01-03 ENCOUNTER — Other Ambulatory Visit: Payer: Self-pay | Admitting: Family Medicine

## 2021-11-11 ENCOUNTER — Other Ambulatory Visit: Payer: Self-pay

## 2021-11-11 ENCOUNTER — Emergency Department
Admission: EM | Admit: 2021-11-11 | Discharge: 2021-11-11 | Disposition: A | Discharge status: Home / Self Care | Payer: Self-pay | Attending: Emergency Medicine | Admitting: Emergency Medicine

## 2021-11-11 ENCOUNTER — Emergency Department: Payer: Self-pay

## 2021-11-11 ENCOUNTER — Encounter: Payer: Self-pay | Admitting: Emergency Medicine

## 2021-11-11 DIAGNOSIS — G930 Cerebral cysts: Secondary | ICD-10-CM | POA: Insufficient documentation

## 2021-11-11 DIAGNOSIS — S199XXA Unspecified injury of neck, initial encounter: Secondary | ICD-10-CM | POA: Diagnosis present

## 2021-11-11 DIAGNOSIS — S80211A Abrasion, right knee, initial encounter: Secondary | ICD-10-CM | POA: Insufficient documentation

## 2021-11-11 DIAGNOSIS — Z23 Encounter for immunization: Secondary | ICD-10-CM | POA: Insufficient documentation

## 2021-11-11 DIAGNOSIS — S134XXA Sprain of ligaments of cervical spine, initial encounter: Secondary | ICD-10-CM | POA: Diagnosis not present

## 2021-11-11 DIAGNOSIS — Y92410 Unspecified street and highway as the place of occurrence of the external cause: Secondary | ICD-10-CM | POA: Diagnosis not present

## 2021-11-11 DIAGNOSIS — J45909 Unspecified asthma, uncomplicated: Secondary | ICD-10-CM | POA: Diagnosis not present

## 2021-11-11 MED ORDER — TETANUS-DIPHTH-ACELL PERTUSSIS 5-2.5-18.5 LF-MCG/0.5 IM SUSY
0.5000 mL | PREFILLED_SYRINGE | Freq: Once | INTRAMUSCULAR | Status: AC
  Administered 2021-11-11: 0.5 mL via INTRAMUSCULAR
  Filled 2021-11-11: qty 0.5

## 2021-11-11 MED ORDER — IBUPROFEN 800 MG PO TABS
800.0000 mg | ORAL_TABLET | ORAL | Status: AC
  Administered 2021-11-11: 800 mg via ORAL
  Filled 2021-11-11: qty 1

## 2021-11-11 MED ORDER — ACETAMINOPHEN 325 MG PO TABS
650.0000 mg | ORAL_TABLET | Freq: Once | ORAL | Status: AC
  Administered 2021-11-11: 650 mg via ORAL
  Filled 2021-11-11: qty 2
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
## 2021-11-11 NOTE — ED Notes (Signed)
Rn to bedside to introduce self to pt. Pt sitting in wheelchair in room.  ?
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
## 2021-11-11 NOTE — ED Notes (Signed)
This RN cleaned patient right knee.  ?

## 2021-11-11 NOTE — Discharge Instructions (Signed)

## 2022-12-04 ENCOUNTER — Telehealth: Payer: Self-pay | Admitting: *Deleted

## 2022-12-04 DIAGNOSIS — Z1159 Encounter for screening for other viral diseases: Secondary | ICD-10-CM

## 2022-12-04 DIAGNOSIS — Z13220 Encounter for screening for lipoid disorders: Secondary | ICD-10-CM

## 2022-12-04 DIAGNOSIS — Z114 Encounter for screening for human immunodeficiency virus [HIV]: Secondary | ICD-10-CM

## 2022-12-04 NOTE — Telephone Encounter (Signed)
-----   Message from Alvina Chou sent at 12/04/2022 11:26 AM EDT ----- Regarding: Lab orders for Tuesday, 4.23.24 Patient is scheduled for CPX labs, please order future labs, Thanks , Camelia Eng

## 2022-12-11 ENCOUNTER — Other Ambulatory Visit (INDEPENDENT_AMBULATORY_CARE_PROVIDER_SITE_OTHER): Payer: Commercial Managed Care - PPO

## 2022-12-11 ENCOUNTER — Other Ambulatory Visit: Payer: Commercial Managed Care - PPO

## 2022-12-11 DIAGNOSIS — Z13220 Encounter for screening for lipoid disorders: Secondary | ICD-10-CM

## 2022-12-11 DIAGNOSIS — Z114 Encounter for screening for human immunodeficiency virus [HIV]: Secondary | ICD-10-CM | POA: Diagnosis not present

## 2022-12-11 DIAGNOSIS — Z1159 Encounter for screening for other viral diseases: Secondary | ICD-10-CM

## 2022-12-11 LAB — LIPID PANEL
Cholesterol: 121 mg/dL (ref 0–200)
HDL: 41.7 mg/dL (ref >39.00)
LDL Cholesterol: 69 mg/dL (ref 0–99)
NonHDL: 79.47
Total CHOL/HDL Ratio: 3
Triglycerides: 53 mg/dL (ref 0.0–149.0)
VLDL: 10.6 mg/dL (ref 0.0–40.0)

## 2022-12-11 LAB — COMPREHENSIVE METABOLIC PANEL
ALT: 34 U/L (ref 0–53)
AST: 27 U/L (ref 0–37)
Albumin: 4.4 g/dL (ref 3.5–5.2)
Alkaline Phosphatase: 58 U/L (ref 39–117)
BUN: 15 mg/dL (ref 6–23)
CO2: 28 mEq/L (ref 19–32)
Calcium: 9.3 mg/dL (ref 8.4–10.5)
Chloride: 104 mEq/L (ref 96–112)
Creatinine, Ser: 1.06 mg/dL (ref 0.40–1.50)
GFR: 96.05 mL/min (ref >60.00)
Glucose, Bld: 78 mg/dL (ref 70–99)
Potassium: 4 mEq/L (ref 3.5–5.1)
Sodium: 139 mEq/L (ref 135–145)
Total Bilirubin: 0.8 mg/dL (ref 0.2–1.2)
Total Protein: 7.1 g/dL (ref 6.0–8.3)

## 2022-12-11 NOTE — Progress Notes (Signed)
No critical labs need to be addressed urgently. We will discuss labs in detail at upcoming office visit.   

## 2022-12-12 LAB — HIV ANTIBODY (ROUTINE TESTING W REFLEX): HIV 1&2 Ab, 4th Generation: NONREACTIVE

## 2022-12-12 LAB — HEPATITIS C ANTIBODY: Hepatitis C Ab: NONREACTIVE

## 2022-12-12 NOTE — Progress Notes (Signed)
No critical labs need to be addressed urgently. We will discuss labs in detail at upcoming office visit.   

## 2022-12-18 ENCOUNTER — Encounter: Payer: Self-pay | Admitting: Family Medicine

## 2022-12-18 ENCOUNTER — Ambulatory Visit (INDEPENDENT_AMBULATORY_CARE_PROVIDER_SITE_OTHER): Payer: Commercial Managed Care - PPO | Admitting: Family Medicine

## 2022-12-18 VITALS — BP 118/84 | HR 67 | Temp 97.8°F | Ht 72.0 in | Wt 280.4 lb

## 2022-12-18 DIAGNOSIS — Z0000 Encounter for general adult medical examination without abnormal findings: Secondary | ICD-10-CM | POA: Diagnosis not present

## 2022-12-18 DIAGNOSIS — J45990 Exercise induced bronchospasm: Secondary | ICD-10-CM

## 2022-12-18 DIAGNOSIS — E6609 Other obesity due to excess calories: Secondary | ICD-10-CM

## 2022-12-18 DIAGNOSIS — Z8679 Personal history of other diseases of the circulatory system: Secondary | ICD-10-CM | POA: Diagnosis not present

## 2022-12-18 DIAGNOSIS — Z6838 Body mass index (BMI) 38.0-38.9, adult: Secondary | ICD-10-CM

## 2022-12-18 DIAGNOSIS — G930 Cerebral cysts: Secondary | ICD-10-CM

## 2022-12-18 MED ORDER — ALBUTEROL SULFATE HFA 108 (90 BASE) MCG/ACT IN AERS: INHALATION_SPRAY | RESPIRATORY_TRACT | 3 refills | Status: AC
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
## 2022-12-18 NOTE — Assessment & Plan Note (Addendum)
Chronic, possible increase in size of intracranial arachnoid cyst on CT scan in 2023.  Referral to neurology for further evaluation and management. Patient is asymptomatic.

## 2022-12-18 NOTE — Assessment & Plan Note (Signed)
Chronic, stable control.  Using albuterol as needed.

## 2022-12-18 NOTE — Assessment & Plan Note (Signed)
Encouraged exercise, weight loss, healthy eating habits. ? ?

## 2022-12-20 ENCOUNTER — Encounter: Payer: Self-pay | Admitting: *Deleted

## 2022-12-20 NOTE — Addendum Note (Signed)
Addended by: Kerby Nora E on: 12/20/2022 08:36 AM   Modules accepted: Orders
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
## 2023-01-18 ENCOUNTER — Other Ambulatory Visit: Payer: Self-pay | Admitting: Neurosurgery

## 2023-01-18 DIAGNOSIS — G930 Cerebral cysts: Secondary | ICD-10-CM

## 2023-02-08 ENCOUNTER — Ambulatory Visit
Admission: RE | Admit: 2023-02-08 | Discharge: 2023-02-08 | Disposition: A | Discharge status: Home / Self Care | Payer: Commercial Managed Care - PPO | Source: Ambulatory Visit | Attending: Neurosurgery | Admitting: Neurosurgery

## 2023-02-08 DIAGNOSIS — G930 Cerebral cysts: Secondary | ICD-10-CM
